# Patient Record
Sex: Female | Born: 1960 | Race: Black or African American | Hispanic: No | Marital: Married | Smoking: Never smoker
Health system: Southern US, Community
[De-identification: ages and names within clinical notes are randomized; demographics above are authoritative.]

## PROBLEM LIST (undated history)

## (undated) DIAGNOSIS — N189 Chronic kidney disease, unspecified: Secondary | ICD-10-CM

## (undated) DIAGNOSIS — I10 Essential (primary) hypertension: Secondary | ICD-10-CM

---

## 2013-12-22 ENCOUNTER — Ambulatory Visit: Payer: Self-pay | Admitting: Emergency Medicine

## 2020-05-30 ENCOUNTER — Ambulatory Visit
Admission: RE | Admit: 2020-05-30 | Discharge: 2020-05-30 | Disposition: A | Discharge status: Home / Self Care | Payer: BC Managed Care – PPO | Source: Ambulatory Visit | Attending: Family Medicine | Admitting: Family Medicine

## 2020-05-30 ENCOUNTER — Ambulatory Visit (INDEPENDENT_AMBULATORY_CARE_PROVIDER_SITE_OTHER): Payer: BC Managed Care – PPO

## 2020-05-30 ENCOUNTER — Other Ambulatory Visit: Payer: Self-pay

## 2020-05-30 VITALS — BP 127/83 | HR 92 | Temp 98.5°F | Resp 18 | Ht 68.0 in | Wt 203.0 lb

## 2020-05-30 DIAGNOSIS — S93602A Unspecified sprain of left foot, initial encounter: Secondary | ICD-10-CM

## 2020-05-30 HISTORY — DX: Essential (primary) hypertension: I10

## 2020-05-30 NOTE — Discharge Instructions (Addendum)
Rest, ice, anti-inflammatories (over the counter)

## 2020-05-30 NOTE — ED Triage Notes (Signed)
Patient states she was exercising on Saturday and felt a "twinge" in her left great toe. She states she is having swelling and only has pain with certain movements of her foot.

## 2020-05-30 NOTE — ED Provider Notes (Signed)
[facility]    CSN: [id number] Arrival date & time: 05/30/20  1244      History   Chief Complaint Chief Complaint  Patient presents with  . Foot Pain    HPI MADELON WELSCH is a 59 y.o. female.   59 yo female with a c/o left foot pain. Denies any traumatic injury. States she walked about 3 miles five days ago and felt a "twinge" on her left great toe. Since then has been bothering her some and now has noticed tenderness on the outside of the foot. Denies any numbness/tingling, rash, drainage.   Foot Pain    Past Medical History:  Diagnosis Date  . Hypertension     There are no problems to display for this patient.   History reviewed. No pertinent surgical history.  OB History   No obstetric history on file.      Home Medications    Prior to Admission medications   Medication Sig Start Date End Date Taking? Authorizing Provider  hydrochlorothiazide (MICROZIDE) 12.5 MG capsule Take by mouth. 01/08/12  Yes [provider]  lisinopril (ZESTRIL) 5 MG tablet Take by mouth. 01/08/12  Yes [provider]    Family History History reviewed. No pertinent family history.  Social History Social History   Tobacco Use  . Smoking status: Never Smoker  . Smokeless tobacco: Never Used  Vaping Use  . Vaping Use: Never used  Substance Use Topics  . Alcohol use: Never  . Drug use: Never     Allergies   Sulfa antibiotics   Review of Systems Review of Systems   Physical Exam Triage Vital Signs ED Triage Vitals  Enc Vitals Group     BP 05/30/20 1305 127/83     Pulse Rate 05/30/20 1305 92     Resp 05/30/20 1305 18     Temp 05/30/20 1305 98.5 F (36.9 C)     Temp Source 05/30/20 1305 Oral     SpO2 05/30/20 1305 100 %     Weight 05/30/20 1302 203 lb (92.1 kg)     Height 05/30/20 1302 5\' 8"  (1.727 m)     Head Circumference --      Peak Flow --      Pain Score 05/30/20 1302 8     Pain Loc --      Pain Edu? --      Excl. in  GC? --    No data found.  Updated Vital Signs BP 127/83 (BP Location: Right Arm)   Pulse 92   Temp 98.5 F (36.9 C) (Oral)   Resp 18   Ht 5\' 8"  (1.727 m)   Wt 92.1 kg   SpO2 100%   BMI 30.87 kg/m   Visual Acuity Right Eye Distance:   Left Eye Distance:   Bilateral Distance:    Right Eye Near:   Left Eye Near:    Bilateral Near:     Physical Exam Vitals and nursing note reviewed.  Constitutional:      General: She is not in acute distress.    Appearance: She is not toxic-appearing or diaphoretic.  Musculoskeletal:     Left foot: Normal range of motion and normal capillary refill. Swelling, tenderness and bony tenderness present. No deformity, bunion, foot drop, prominent metatarsal heads, laceration or crepitus. Normal pulse.     Comments: Foot neurovascularly intact  Neurological:     Mental Status: She is alert.      UC Treatments / Results  Labs (all labs ordered are listed, but only abnormal results are displayed) Labs Reviewed - No data to display  EKG   Radiology DG Foot Complete Left  Result Date: 05/30/2020 CLINICAL DATA:  Left great toe pain. EXAM: LEFT FOOT - COMPLETE 3+ VIEW COMPARISON:  None. FINDINGS: There is no evidence of fracture or dislocation. There is no evidence of arthropathy or other focal bone abnormality. Prominent plantar enthesophyte. Soft tissues are unremarkable. IMPRESSION: 1. No acute osseous abnormality or significant degenerative changes. Electronically Signed   By: Obie Dredge M.D.   On: 05/30/2020 13:37    Procedures Procedures (including critical care time)  Medications Ordered in UC Medications - No data to display  Initial Impression / Assessment and Plan / UC Course  I have reviewed the triage vital signs and the nursing notes.  Pertinent labs & imaging results that were available during my care of the patient were reviewed by me and considered in my medical decision making (see chart for details).      Final  Clinical Impressions(s) / UC Diagnoses   Final diagnoses:  Foot sprain, left, initial encounter     Discharge Instructions     Rest, ice, anti-inflammatories (over the counter)    ED Prescriptions    None      1. Labs/x-ray results and diagnosis reviewed with patient/parent/guardian/family 2. rx as per orders above; reviewed possible side effects, interactions, risks and benefits  3. Recommend supportive treatment with  4. Follow-up prn if symptoms worsen or don't improve   PDMP not reviewed this encounter.   Payton Mccallum, MD 05/30/20 [phone]

## 2020-07-06 ENCOUNTER — Ambulatory Visit (INDEPENDENT_AMBULATORY_CARE_PROVIDER_SITE_OTHER): Payer: BC Managed Care – PPO

## 2020-07-06 ENCOUNTER — Other Ambulatory Visit: Payer: Self-pay

## 2020-07-06 ENCOUNTER — Ambulatory Visit
Admission: RE | Admit: 2020-07-06 | Discharge: 2020-07-06 | Disposition: A | Discharge status: Home / Self Care | Payer: BC Managed Care – PPO | Source: Ambulatory Visit | Attending: Family Medicine | Admitting: Family Medicine

## 2020-07-06 VITALS — BP 134/77 | HR 75 | Temp 98.0°F | Resp 14 | Ht 68.5 in | Wt 198.0 lb

## 2020-07-06 DIAGNOSIS — S99922A Unspecified injury of left foot, initial encounter: Secondary | ICD-10-CM | POA: Diagnosis not present

## 2020-07-06 DIAGNOSIS — S92919A Unspecified fracture of unspecified toe(s), initial encounter for closed fracture: Secondary | ICD-10-CM

## 2020-07-06 MED ORDER — MELOXICAM 15 MG PO TABS: 15.0000 mg | ORAL_TABLET | Freq: Every day | ORAL | 0 refills | Status: DC | PRN

## 2020-07-06 NOTE — Discharge Instructions (Signed)
Rest, ice, elevate.  Post op shoe.  Follow up with podiatry.  Take care  Dr. Adriana Simas

## 2020-07-06 NOTE — ED Triage Notes (Signed)
Patient c/o left big toe pain that started on Monday.  Patient states that she was seen here last month when she sprained her left big toe.

## 2020-07-07 NOTE — ED Provider Notes (Signed)
[facility]    CSN: [id number] Arrival date & time: 07/06/20  1800      History   Chief Complaint Chief Complaint  Patient presents with  . Toe Pain    left big   HPI   59 year old female presents with the above complaint.  Patient states that she previously injured this foot and great toe.  She was seen here and was told her x-ray was negative.  Patient states that she reinjured the left great toe approximately 10 days ago.  She states that she hit her toe.  Patient reports that she has had ongoing pain since that time.  At times it is better but then certain activities seem to worsen it.  She states that her pain is 8/10 in severity.  No other reported injury.  No other complaints.  Past Medical History:  Diagnosis Date  . Hypertension    Home Medications    Prior to Admission medications   Medication Sig Start Date End Date Taking? Authorizing Provider  hydrochlorothiazide (MICROZIDE) 12.5 MG capsule Take by mouth. 01/08/12  Yes [provider]  lisinopril (ZESTRIL) 5 MG tablet Take by mouth. 01/08/12  Yes [provider]  meloxicam (MOBIC) 15 MG tablet Take 1 tablet (15 mg total) by mouth daily as needed for pain. 07/06/20   Tommie Sams, DO   Social History Social History   Tobacco Use  . Smoking status: Never Smoker  . Smokeless tobacco: Never Used  Vaping Use  . Vaping Use: Never used  Substance Use Topics  . Alcohol use: Never  . Drug use: Never     Allergies   Sulfa antibiotics   Review of Systems Review of Systems  Constitutional: Negative.   Musculoskeletal:       Pain of left great toe.   Physical Exam Triage Vital Signs ED Triage Vitals  Enc Vitals Group     BP 07/06/20 1827 134/77     Pulse Rate 07/06/20 1827 75     Resp 07/06/20 1827 14     Temp 07/06/20 1827 98 F (36.7 C)     Temp Source 07/06/20 1827 Oral     SpO2 07/06/20 1827 100 %     Weight 07/06/20 1825 198 lb (89.8 kg)     Height 07/06/20 1825 5'  8.5" (1.74 m)     Head Circumference --      Peak Flow --      Pain Score 07/06/20 1825 8     Pain Loc --      Pain Edu? --      Excl. in GC? --    Updated Vital Signs BP 134/77 (BP Location: Right Arm)   Pulse 75   Temp 98 F (36.7 C) (Oral)   Resp 14   Ht 5' 8.5" (1.74 m)   Wt 89.8 kg   SpO2 100%   BMI 29.67 kg/m   Visual Acuity Right Eye Distance:   Left Eye Distance:   Bilateral Distance:    Right Eye Near:   Left Eye Near:    Bilateral Near:     Physical Exam Vitals and nursing note reviewed.  Constitutional:      General: She is not in acute distress.    Appearance: Normal appearance. She is not ill-appearing.  HENT:     Head: Normocephalic and atraumatic.  Eyes:     General:        Right eye: No discharge.  Left eye: No discharge.     Conjunctiva/sclera: Conjunctivae normal.  Pulmonary:     Effort: Pulmonary effort is normal. No respiratory distress.  Musculoskeletal:     Comments: Left great toe with tenderness to palpation at the MTP joint.  Mild swelling.  Mild redness.  Neurological:     Mental Status: She is alert.  Psychiatric:        Mood and Affect: Mood normal.        Behavior: Behavior normal.    UC Treatments / Results  Labs (all labs ordered are listed, but only abnormal results are displayed) Labs Reviewed - No data to display  EKG   Radiology DG Toe Great Left  Result Date: 07/06/2020 CLINICAL DATA:  Injury 10 days ago EXAM: LEFT GREAT TOE COMPARISON:  05/30/2020 FINDINGS: Possible subacute to chronic fracture deformity at the base of the proximal phalanx. No subluxation. There is soft tissue swelling at the first MTP joint. IMPRESSION: Possible subacute to chronic fracture deformity at the base of the first proximal phalanx. Positive for soft tissue swelling at the MTP joint. Electronically Signed   By: Jasmine Pang M.D.   On: 07/06/2020 18:55    Procedures Procedures (including critical care time)  Medications Ordered  in UC Medications - No data to display  Initial Impression / Assessment and Plan / UC Course  I have reviewed the triage vital signs and the nursing notes.  Pertinent labs & imaging results that were available during my care of the patient were reviewed by me and considered in my medical decision making (see chart for details).    59 year old female presents with injury to left great toe.  X-ray obtained.  I did not appreciate a definitive fracture.  Radiology reports that there is likely a subacute to chronic fracture deformity at the base of the first proximal phalanx.  Patient is tender at this location.  Placed in a postop shoe.  Meloxicam for pain.  Advised to follow-up with podiatry if symptoms persist.  Final Clinical Impressions(s) / UC Diagnoses   Final diagnoses:  Fracture of proximal phalanx of toe     Discharge Instructions     Rest, ice, elevate.  Post op shoe.  Follow up with podiatry.  Take care  Dr. Adriana Simas    ED Prescriptions    Medication Sig Dispense Auth. Provider   meloxicam (MOBIC) 15 MG tablet Take 1 tablet (15 mg total) by mouth daily as needed for pain. 30 tablet Tommie Sams, DO     PDMP not reviewed this encounter.   Everlene Other [place], [place] 07/07/20 [phone]

## 2021-05-23 IMAGING — CR DG TOE GREAT 2+V*L*
3 series · 3 of 3 positions shown · non-contrast
Comparison: 05/30/2020

CLINICAL DATA: Injury 10 days ago

EXAM:
LEFT GREAT TOE

[toe ap]
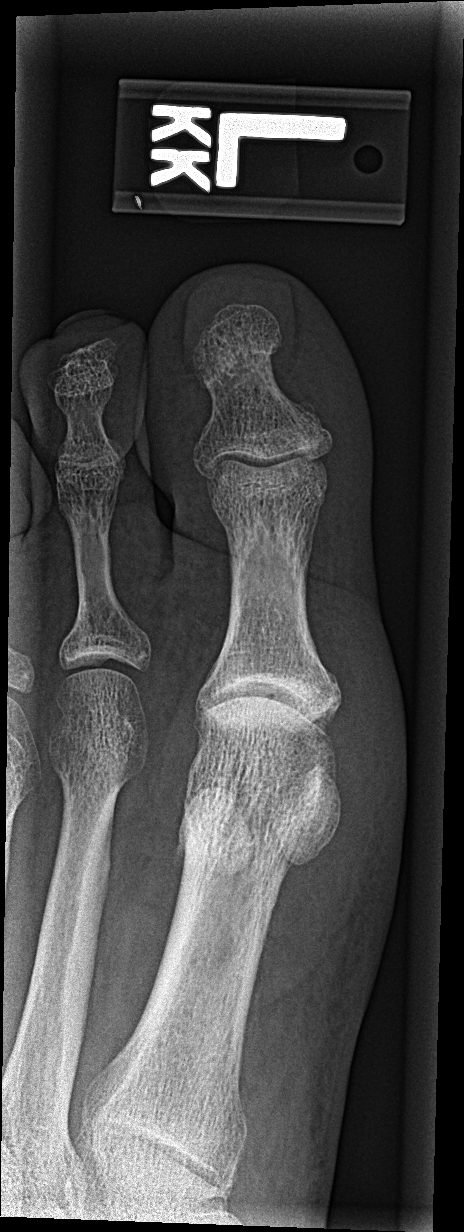

[toe obl]
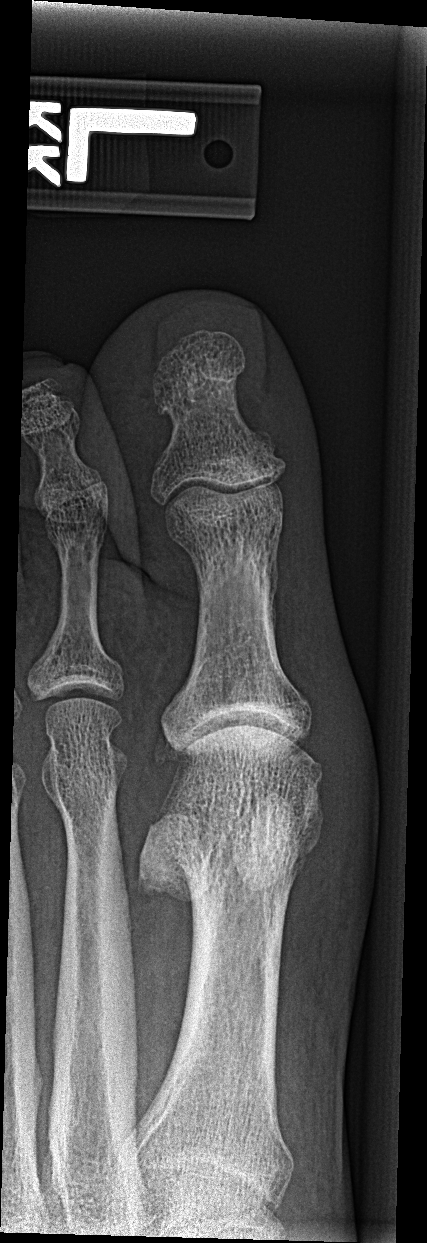

[toe lat]
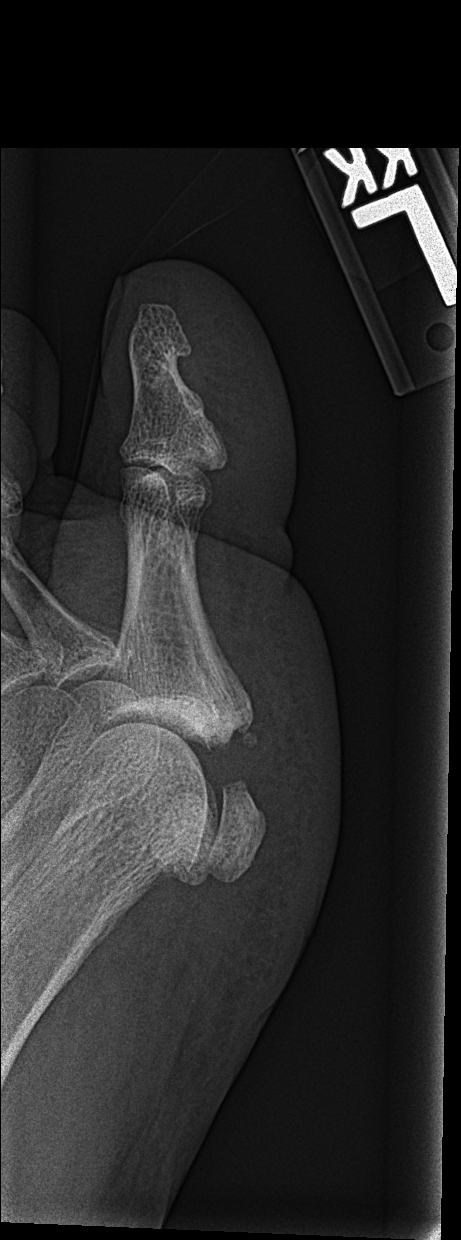

[3 of 3 positions shown; findings below may reference images not displayed]

FINDINGS: Possible subacute to chronic fracture deformity at the base of the
proximal phalanx. No subluxation. There is soft tissue swelling at
the first MTP joint.
IMPRESSION: Possible subacute to chronic fracture deformity at the base of the
first proximal phalanx. Positive for soft tissue swelling at the MTP
joint.

## 2023-07-22 ENCOUNTER — Ambulatory Visit (INDEPENDENT_AMBULATORY_CARE_PROVIDER_SITE_OTHER): Payer: No Typology Code available for payment source

## 2023-07-22 ENCOUNTER — Ambulatory Visit
Admission: RE | Admit: 2023-07-22 | Discharge: 2023-07-22 | Disposition: A | Discharge status: Home / Self Care | Payer: No Typology Code available for payment source | Source: Ambulatory Visit | Attending: Family Medicine | Admitting: Family Medicine

## 2023-07-22 VITALS — BP 129/87 | HR 85 | Temp 98.3°F | Resp 18 | Wt 201.0 lb

## 2023-07-22 DIAGNOSIS — M25572 Pain in left ankle and joints of left foot: Secondary | ICD-10-CM

## 2023-07-22 DIAGNOSIS — M79675 Pain in left toe(s): Secondary | ICD-10-CM

## 2023-07-22 HISTORY — DX: Chronic kidney disease, unspecified: N18.9

## 2023-07-22 MED ORDER — OXYCODONE HCL 5 MG PO TABS: 10.0000 mg | ORAL_TABLET | Freq: Three times a day (TID) | ORAL | 0 refills | Status: DC | PRN

## 2023-07-22 NOTE — Discharge Instructions (Addendum)
Your foot x-ray did not show any broken bones or dislocated bones.  You did have some swelling around your big toe as seen on exam.  Take ibuprofen at 1000 to 1500 mg twice a day as needed for pain. Max total of 8-500 mg pills a day.   Keep your foot elevated to help get rid of the swelling.  Apply some Voltaren gel to your big toe to help with pain and swelling.

## 2023-07-22 NOTE — ED Provider Notes (Signed)
[facility]    CSN: [id number] Arrival date & time: 07/22/23  1339      History   Chief Complaint Chief Complaint  Patient presents with   Foot Injury    Great toe left foot; not sure if sprain or fracture. Unable to put pressure on it. - Entered by patient    HPI  HPI Brooke Suarez is a 62 y.o. female.   Brooke Suarez presents for  left foot pain.She was in the mall walking and feels liek she may have stepped incorrectly on Friday.  This morning, she felt like she "was giving birth through her toe."  Took 1000 mg Tylenol without relief.  Has pain with movement that is 2/10 but this morning it was "25/10".  Pain worse with walking.  She has history of fracture of her big toe on the same foot.    Past Medical History:  Diagnosis Date   CKD (chronic kidney disease)    Hypertension     There are no problems to display for this patient.   History reviewed. No pertinent surgical history.  OB History   No obstetric history on file.      Home Medications    Prior to Admission medications   Medication Sig Start Date End Date Taking? Authorizing Provider  hydrochlorothiazide (MICROZIDE) 12.5 MG capsule Take by mouth. 01/08/12  Yes [provider]  lisinopril (ZESTRIL) 5 MG tablet Take by mouth. 01/08/12  Yes [provider]    Family History History reviewed. No pertinent family history.  Social History Social History   Tobacco Use   Smoking status: Never   Smokeless tobacco: Never  Vaping Use   Vaping status: Never Used  Substance Use Topics   Alcohol use: Never   Drug use: Never     Allergies   Nsaids and Sulfa antibiotics   Review of Systems Review of Systems: :negative unless otherwise stated in HPI.      Physical Exam Triage Vital Signs ED Triage Vitals  Encounter Vitals Group     BP 07/22/23 1422 130/85     Systolic BP Percentile --      Diastolic BP Percentile --      Pulse Rate 07/22/23 1422 85     Resp  07/22/23 1422 18     Temp 07/22/23 1422 98.3 F (36.8 C)     Temp Source 07/22/23 1422 Oral     SpO2 07/22/23 1422 95 %     Weight 07/22/23 1420 201 lb (91.2 kg)     Height --      Head Circumference --      Peak Flow --      Pain Score 07/22/23 1419 3     Pain Loc --      Pain Education --      Exclude from Growth Chart --    No data found.  Updated Vital Signs BP 129/87 (BP Location: Right Arm)   Pulse 85   Temp 98.3 F (36.8 C) (Oral)   Resp 18   Wt 91.2 kg   SpO2 95%   BMI 30.12 kg/m   Visual Acuity Right Eye Distance:   Left Eye Distance:   Bilateral Distance:    Right Eye Near:   Left Eye Near:    Bilateral Near:     Physical Exam GEN: well appearing female in no acute distress  CVS: well perfused  RESP: speaking in full sentences without pause, no respiratory distress  MSK:  Ankle/Foot, left: TTP noted at the left great toe and along the shaft of the 5th metatarsal. Mild swelling, ecchymosis. No visible erythema or bony deformity. No notable pes planus deformity. Transverse arch grossly intact;  No evidence of tibiotalar deviation; Range of motion is full in all directions. Strength is 5/5 in all directions. No tenderness at the insertion/body/myotendinous junction of the Achilles tendon; No tenderness on posterior aspects of lateral and medial malleolus; Unremarkable squeeze; Talar dome nontender; Unremarkable calcaneal squeeze; No plantar calcaneal tenderness; No tenderness over the navicular prominence or  over cuboid; No pain at base of 5th MT;  Able to walk 4 steps.      UC Treatments / Results  Labs (all labs ordered are listed, but only abnormal results are displayed) Labs Reviewed - No data to display  EKG   Radiology DG Foot Complete Left  Result Date: 07/22/2023 CLINICAL DATA:  Great toe pain. Lateral sided foot pain. History of prior fracture of the great toe. EXAM: LEFT FOOT - COMPLETE 3+ VIEW COMPARISON:  Left great toe  radiographs-07/06/2020; left foot radiographs-05/30/2020 FINDINGS: Apparent soft tissue swelling about the first MTP joint. No associated fracture or dislocation. No significant hallux valgus deformity. No degenerative change. No discrete erosions. No radiopaque foreign body. Small plantar calcaneal spur. Minimal enthesopathic change involving the Achilles tendon insertion site. IMPRESSION: 1. Apparent soft tissue swelling about the first MTP joint without associated fracture or dislocation. Additionally there is no radiographic evidence of a crystalline/gouty arthritis. 2. Small plantar calcaneal spur. Electronically Signed   By: Simonne Come M.D.   On: 07/22/2023 15:24     Procedures Procedures (including critical care time)  Medications Ordered in UC Medications - No data to display  Initial Impression / Assessment and Plan / UC Course  I have reviewed the triage vital signs and the nursing notes.  Pertinent labs & imaging results that were available during my care of the patient were reviewed by me and considered in my medical decision making (see chart for details).      Pt is a 62 y.o.  female with acute left foot pain.  On exam, she has lateral fifth metatarsal shaft tenderness but no base tenderness as well as great toe tenderness with mild swelling and ecchymosis.  Obtained left foot  plain films.  Personally interpreted by me were unremarkable for fracture or dislocation. Radiologist report reviewed and additionally notes soft tissue swelling about the first MTP joint and a small plantar calcaneal spur.  Declined crutches.  Patient to gradually return to normal activities, as tolerated and continue ordinary activities within the limits permitted by pain. Prescribed oxycodone by accident call the pharmacy and cancel this prescription.  Patient to use Tylenol and Voltaren gel.    Patient to follow up with orthopedic provider, if symptoms do not improve with conservative treatment.  Return  and ED precautions given. Understanding voiced. Discussed MDM, treatment plan and plan for follow-up with patient who agrees with plan.   Final Clinical Impressions(s) / UC Diagnoses   Final diagnoses:  Pain of joint of left ankle and foot     Discharge Instructions      Your foot x-ray did not show any broken bones or dislocated bones.  You did have some swelling around your big toe as seen on exam.  Take ibuprofen at 1000 to 1500 mg twice a day as needed for pain. Max total of 8-500 mg pills a day.   Keep your foot elevated to help  get rid of the swelling.  Apply some Voltaren gel to your big toe to help with pain and swelling.         I have reviewed the PDMP during this encounter.   Katha Cabal, DO 07/25/23 1345

## 2023-07-22 NOTE — ED Triage Notes (Signed)
X 3 days Great toe left foot; not sure if sprain or fracture. Unable to put pressure on it
